# Patient Record
Sex: Female | Born: 1980 | Race: White | Hispanic: No | Marital: Married | Smoking: Light tobacco smoker
Health system: Southern US, Community
[De-identification: ages and names within clinical notes are randomized; demographics above are authoritative.]

## PROBLEM LIST (undated history)

## (undated) DIAGNOSIS — I8310 Varicose veins of unspecified lower extremity with inflammation: Secondary | ICD-10-CM

## (undated) DIAGNOSIS — IMO0001 Reserved for inherently not codable concepts without codable children: Secondary | ICD-10-CM

## (undated) DIAGNOSIS — J45909 Unspecified asthma, uncomplicated: Secondary | ICD-10-CM

## (undated) DIAGNOSIS — M2550 Pain in unspecified joint: Secondary | ICD-10-CM

## (undated) DIAGNOSIS — L309 Dermatitis, unspecified: Secondary | ICD-10-CM

## (undated) DIAGNOSIS — F411 Generalized anxiety disorder: Secondary | ICD-10-CM

## (undated) DIAGNOSIS — J309 Allergic rhinitis, unspecified: Secondary | ICD-10-CM

## (undated) HISTORY — DX: Dermatitis, unspecified: L30.9

## (undated) HISTORY — DX: Generalized anxiety disorder: F41.1

## (undated) HISTORY — DX: Varicose veins of unspecified lower extremity with inflammation: I83.10

## (undated) HISTORY — PX: WISDOM TOOTH EXTRACTION: SHX21

## (undated) HISTORY — DX: Allergic rhinitis, unspecified: J30.9

## (undated) HISTORY — DX: Reserved for inherently not codable concepts without codable children: IMO0001

## (undated) HISTORY — DX: Pain in unspecified joint: M25.50

## (undated) HISTORY — PX: CHOLECYSTECTOMY: SHX55

## (undated) HISTORY — DX: Unspecified asthma, uncomplicated: J45.909

---
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
## 2000-03-20 ENCOUNTER — Other Ambulatory Visit: Admission: RE | Admit: 2000-03-20 | Discharge: 2000-03-20 | Payer: Self-pay | Admitting: Obstetrics and Gynecology

## 2001-05-26 ENCOUNTER — Other Ambulatory Visit: Admission: RE | Admit: 2001-05-26 | Discharge: 2001-05-26 | Payer: Self-pay | Admitting: Obstetrics and Gynecology

## 2002-09-14 ENCOUNTER — Ambulatory Visit (HOSPITAL_COMMUNITY): Admission: RE | Admit: 2002-09-14 | Discharge: 2002-09-14 | Payer: Self-pay | Admitting: Internal Medicine

## 2006-05-12 ENCOUNTER — Emergency Department (HOSPITAL_COMMUNITY): Admission: EM | Admit: 2006-05-12 | Discharge: 2006-05-13 | Payer: Self-pay | Admitting: Emergency Medicine

## 2007-09-28 ENCOUNTER — Encounter: Admission: RE | Admit: 2007-09-28 | Discharge: 2007-09-28 | Payer: Self-pay | Admitting: Obstetrics and Gynecology

## 2008-06-14 IMAGING — US US TRANSVAGINAL NON-OB
1 series · 14 of 25 positions shown · non-contrast
Comparison: none

CLINICAL DATA: Left-sided pain and swelling.
 TRANSABDOMINAL AND TRANSVAGINAL PELVIC ULTRASOUND:
TECHNIQUE: Both transabdominal and transvaginal ultrasound examinations of the pelvis were performed including evaluation of the uterus, ovaries, adnexal regions, and pelvic cul-de-sac.

[Series 1: unknown · 0.32mm/px · 14 of 32 slices shown]
[im 1/32]
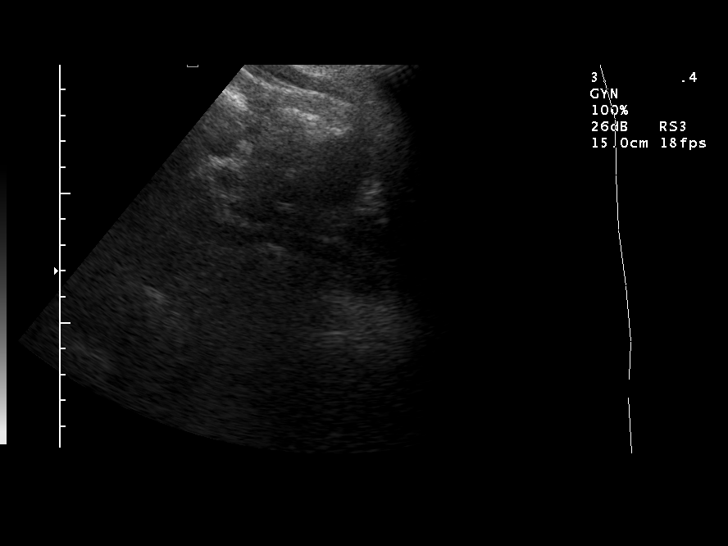
[im 3/32]
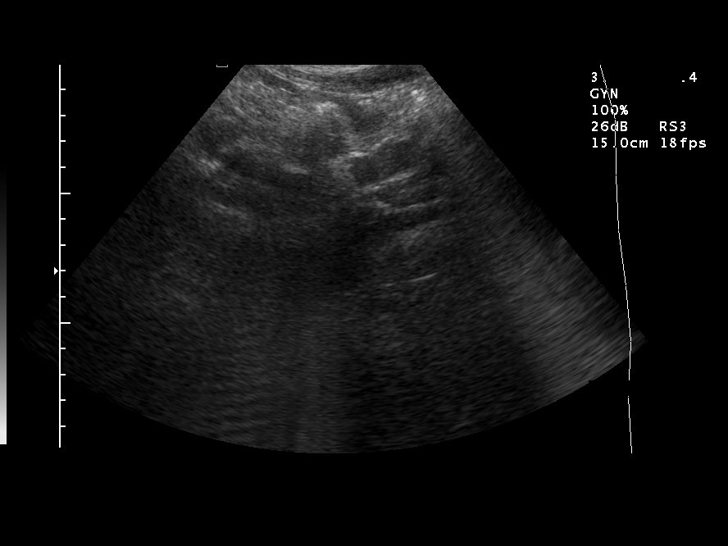
[im 6/32]
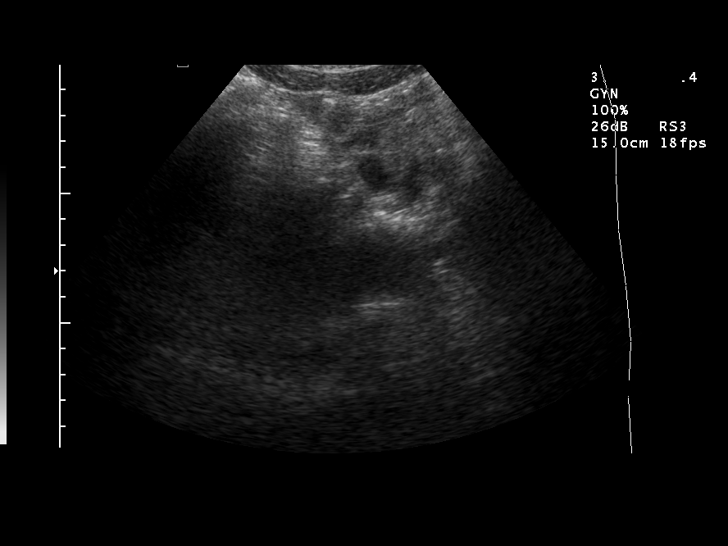
[im 8/32]
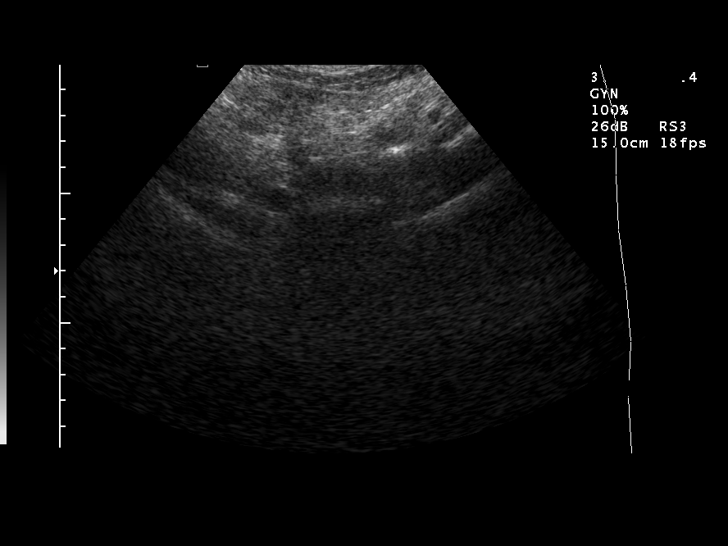
[im 11/32]
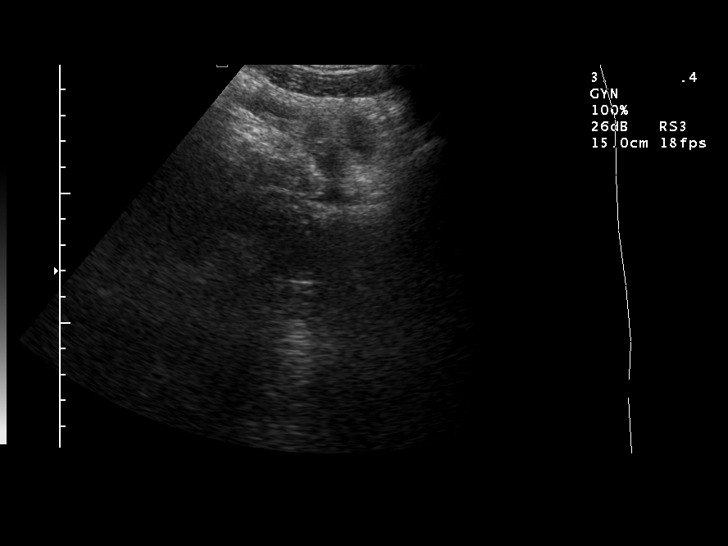
[im 12/32]
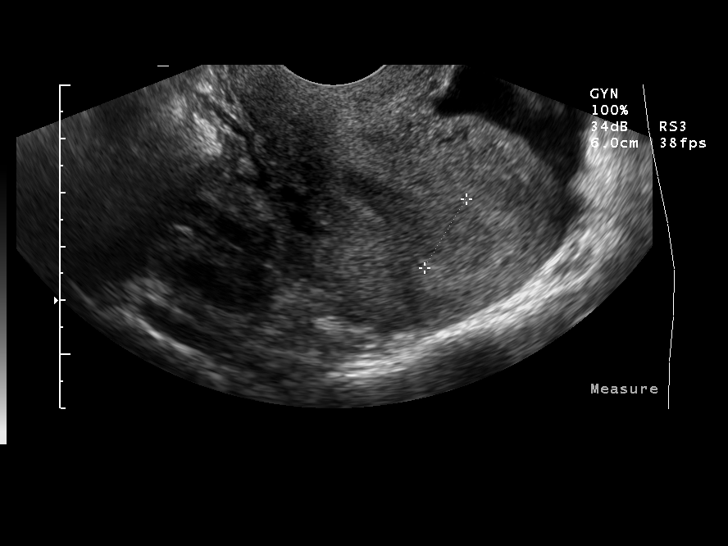
[im 15/32]
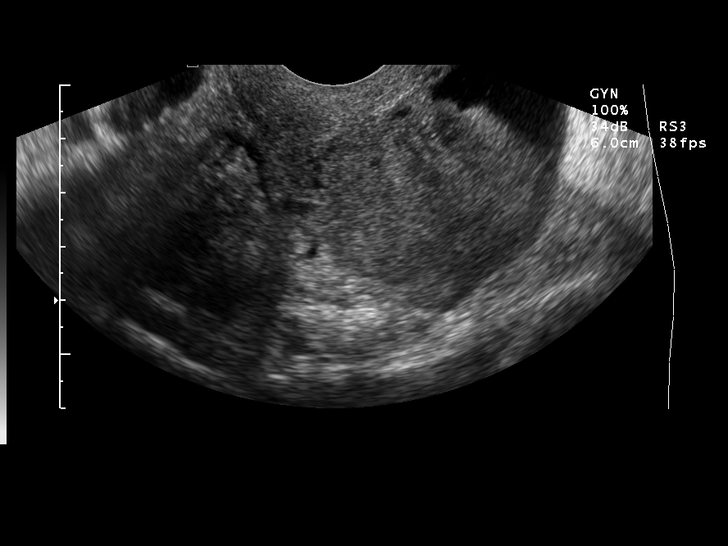
[im 17/32]
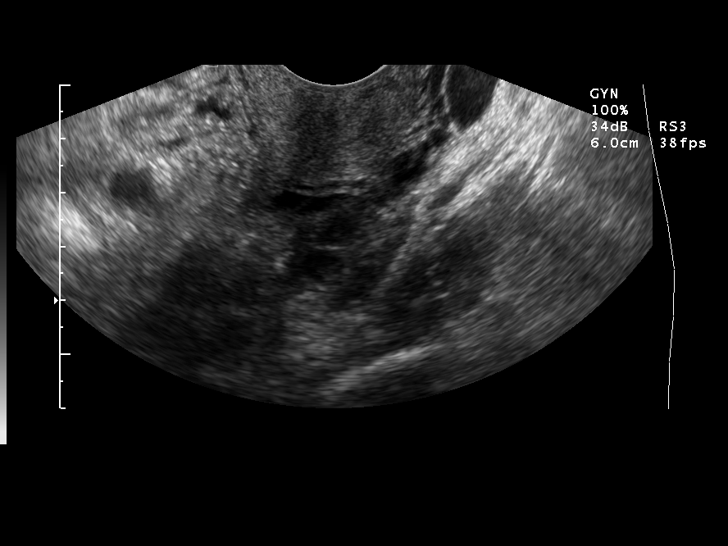
[im 20/32]
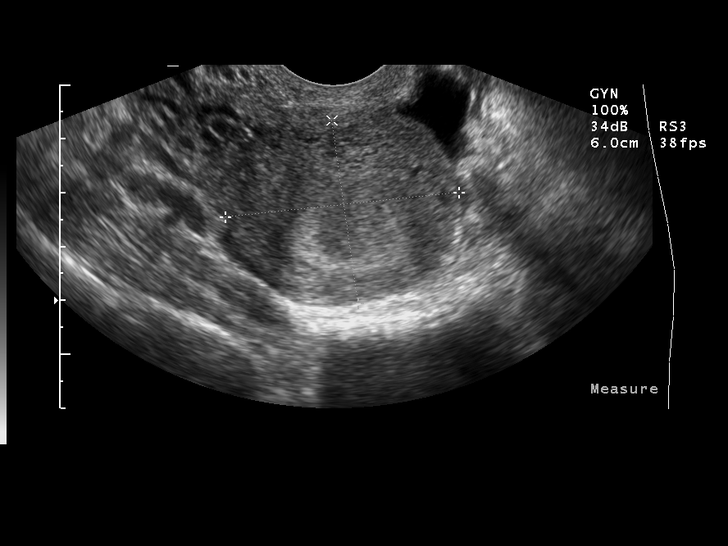
[im 21/32]
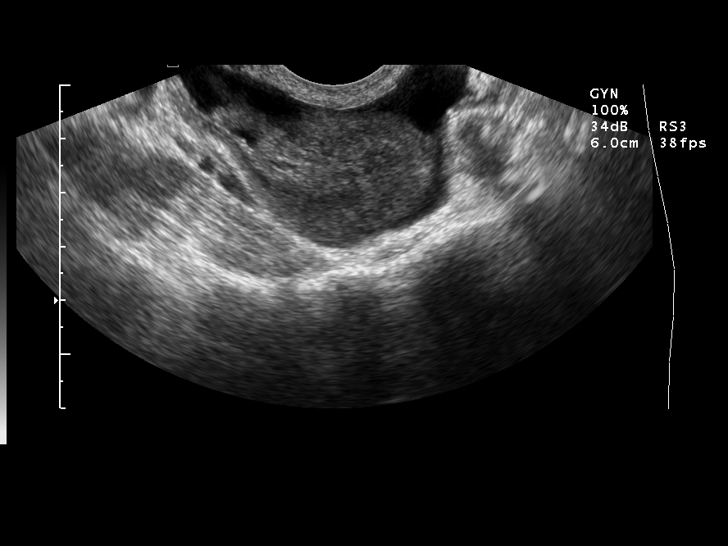
[im 24/32]
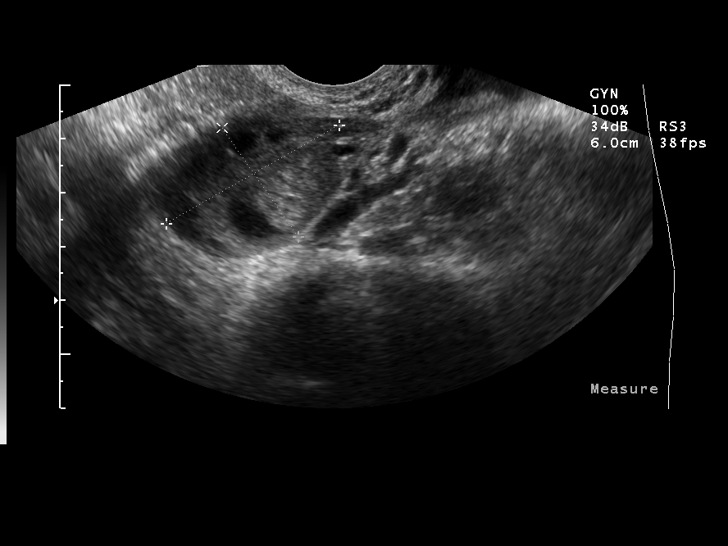
[im 26/32]
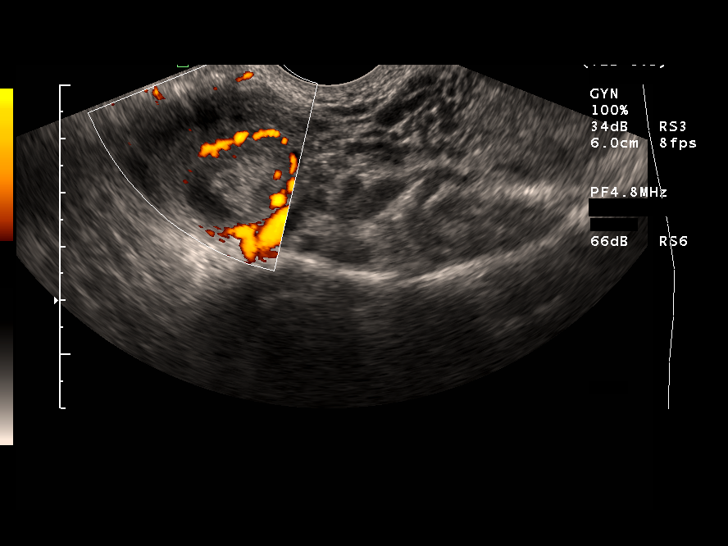
[im 29/32]
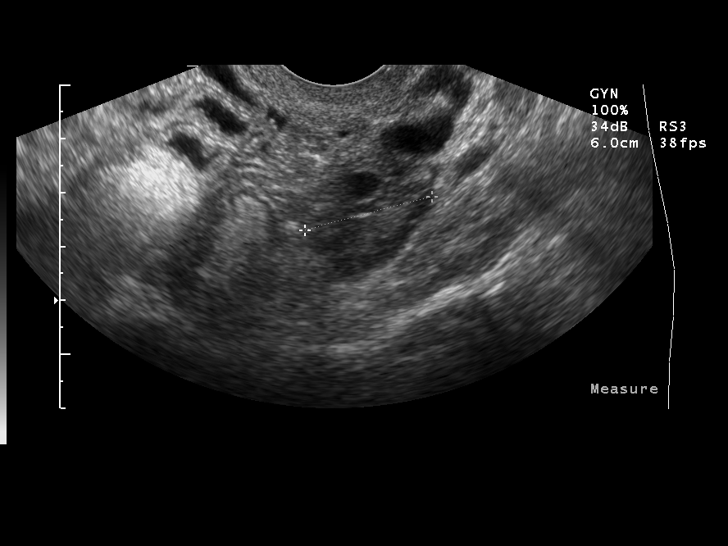
[im 32/32]
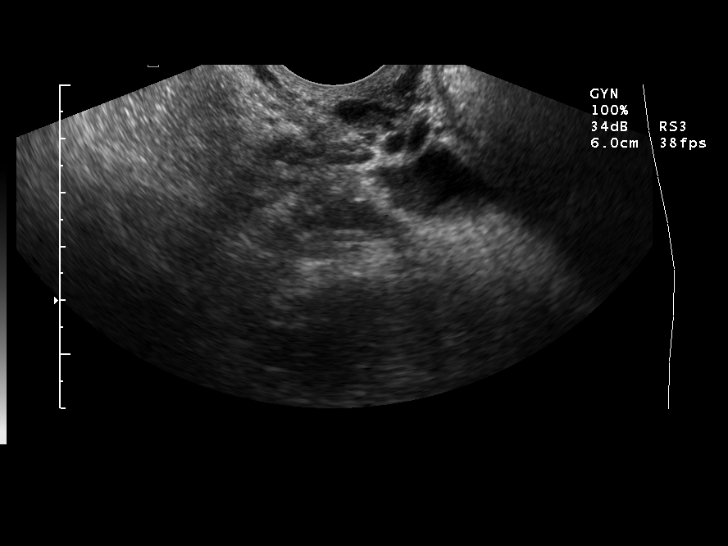

[14 of 25 positions shown; findings below may reference images not displayed]

FINDINGS: The uterus measures 6.9 x 3.4 x 4.4 cm and has an endometrial stripe thickness of approximately 1.2 cm.  The right ovary measures 3.7 x 2.5 x 2.3 cm. The left ovary measures 3.6 x 2.0 x 2.4 cm.  There is a small to moderate amount of free pelvic fluid.
IMPRESSION: Small to moderate amount of free pelvic fluid. Otherwise, negative.

## 2008-11-30 ENCOUNTER — Encounter: Payer: Self-pay | Admitting: Infectious Diseases

## 2008-12-01 ENCOUNTER — Ambulatory Visit: Payer: Self-pay | Admitting: Infectious Diseases

## 2008-12-01 DIAGNOSIS — K1120 Sialoadenitis, unspecified: Secondary | ICD-10-CM | POA: Insufficient documentation

## 2008-12-01 LAB — CONVERTED CEMR LAB: EBV NA IgG: 1.91 — ABNORMAL HIGH

## 2008-12-25 ENCOUNTER — Encounter: Payer: Self-pay | Admitting: Infectious Diseases

## 2009-04-26 ENCOUNTER — Inpatient Hospital Stay (HOSPITAL_COMMUNITY): Admission: AD | Admit: 2009-04-26 | Discharge: 2009-04-26 | Payer: Self-pay | Admitting: Obstetrics and Gynecology

## 2010-06-17 LAB — URINALYSIS, ROUTINE W REFLEX MICROSCOPIC
Ketones, ur: 40 mg/dL — AB
Leukocytes, UA: NEGATIVE
Nitrite: NEGATIVE
Protein, ur: NEGATIVE mg/dL

## 2010-06-17 LAB — URINE MICROSCOPIC-ADD ON

## 2010-06-17 LAB — COMPREHENSIVE METABOLIC PANEL
ALT: 19 U/L (ref 0–35)
Alkaline Phosphatase: 44 U/L (ref 39–117)
CO2: 22 mEq/L (ref 19–32)
Chloride: 104 mEq/L (ref 96–112)
GFR calc Af Amer: >60 mL/min (ref >60)
Glucose, Bld: 83 mg/dL (ref 70–99)
Potassium: 3.7 mEq/L (ref 3.5–5.1)
Sodium: 135 mEq/L (ref 135–145)
Total Bilirubin: 0.6 mg/dL (ref 0.3–1.2)
Total Protein: 6.6 g/dL (ref 6.0–8.3)

## 2010-06-17 LAB — WET PREP, GENITAL: Trich, Wet Prep: NONE SEEN

## 2010-06-17 LAB — CBC
MCHC: 34.3 g/dL (ref 30.0–36.0)
MCV: 93.7 fL (ref 78.0–100.0)
RBC: 3.62 MIL/uL — ABNORMAL LOW (ref 3.87–5.11)
RDW: 13.1 % (ref 11.5–15.5)

## 2010-06-17 LAB — GC/CHLAMYDIA PROBE AMP, GENITAL: Chlamydia, DNA Probe: POSITIVE — AB

## 2010-06-17 LAB — POCT PREGNANCY, URINE: Preg Test, Ur: NEGATIVE

## 2010-08-16 NOTE — H&P (Signed)
[facility]  Patient:    Megan Hardy, Megan Hardy                   MRN: [id number] Adm. Date:  [id number] Disc. Date: [id number] Attending:  Katha Cabal                         History and Physical  CHIEF COMPLAINT:  Right upper quadrant pain, chronic cystitis.  HISTORY OF PRESENT ILLNESS:  This is an 30 year old girl whose had recurrent bouts of nausea and vomiting. It is felt that she had gallstones on an ultrasound and based on her clinical behavior.  PAST MEDICAL HISTORY:  Aside from these bouts of upper abdominal pain, unremarkable.  PHYSICAL EXAMINATION:  GENERAL:  Thin, white, female.  HEENT:  Unremarkable.  CHEST:  Clear.  HEART:  Sinus rhythm without murmurs or gallops.  ABDOMEN:  Nontender presently.  IMPRESSION:  Chronic cholecystitis.  PLAN:  Lap chole. DD:  08/21/99 TD:  08/21/99 Job: [id number]

## 2011-05-30 IMAGING — US US TRANSVAGINAL NON-OB
1 series · 14 of 23 positions shown · non-contrast
Comparison: 05/12/2006

CLINICAL DATA: Pelvic pain.  Vaginal bleeding.  Nausea vomiting.
LMP 04/09/2009

TRANSABDOMINAL AND TRANSVAGINAL ULTRASOUND OF PELVIS
TECHNIQUE: Both transabdominal and transvaginal ultrasound
examinations of the pelvis were performed including evaluation of
the uterus, ovaries, adnexal regions, and pelvic cul-de-sac.

[Series 1: us pelvis complete · 0.27mm/px · 23 acquisitions, 14 frames shown]
[im 1/23]
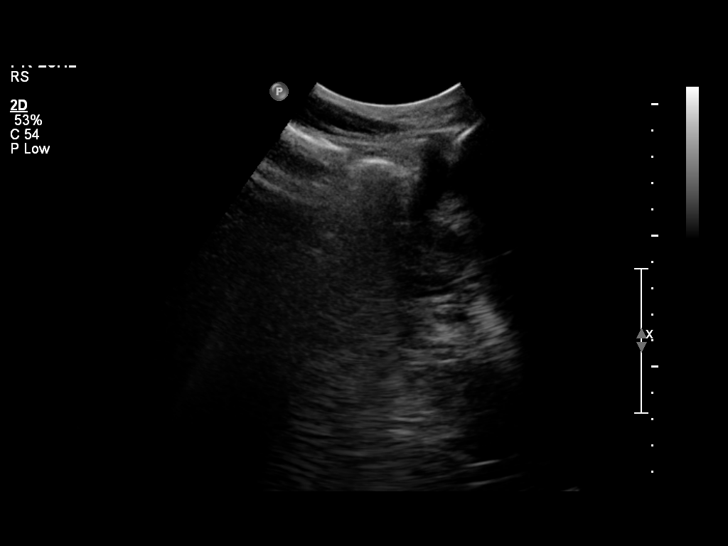
[im 3/23]
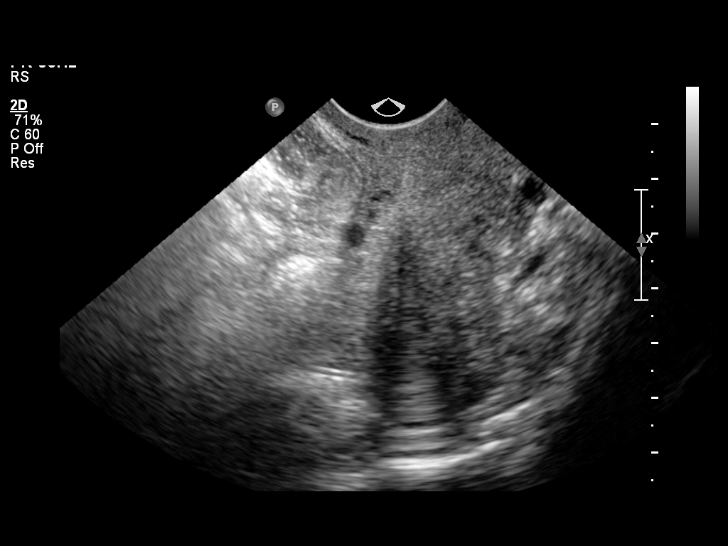
[im 5/23]
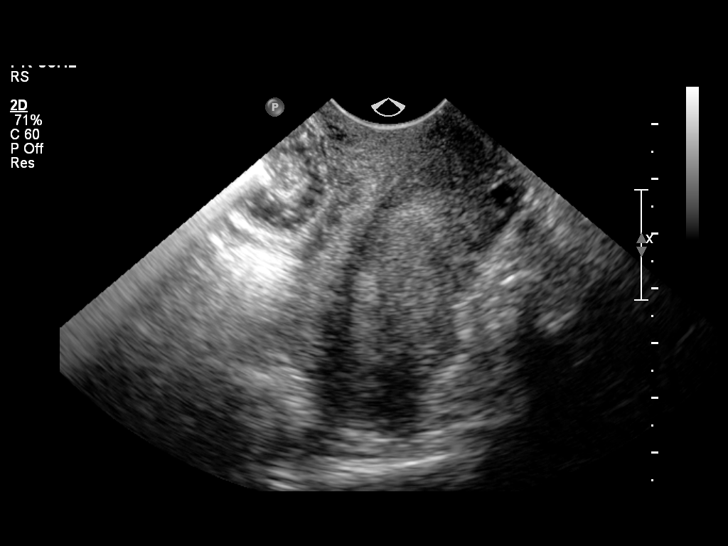
[im 6/23]
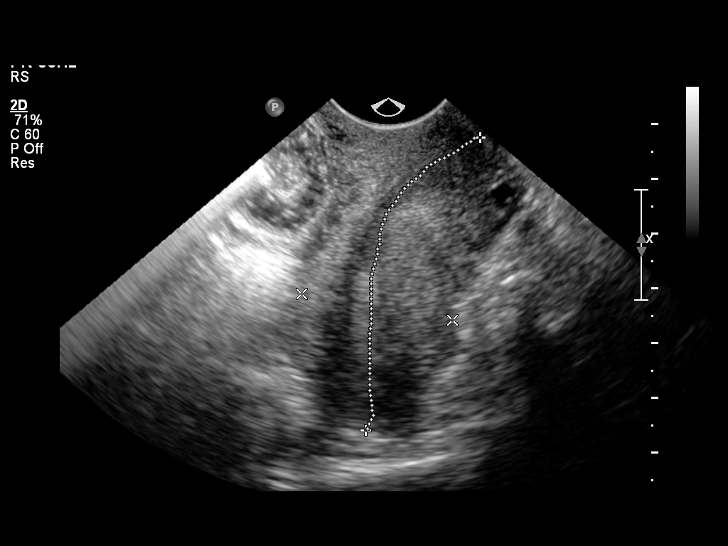
[im 8/23]
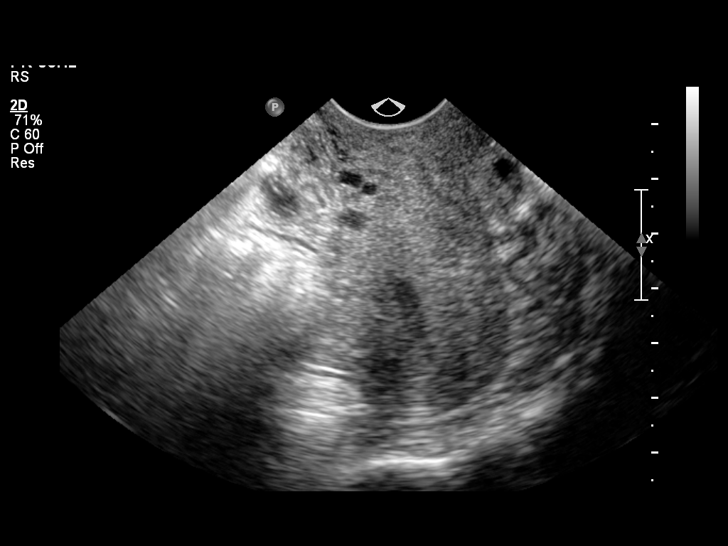
[im 10/23]
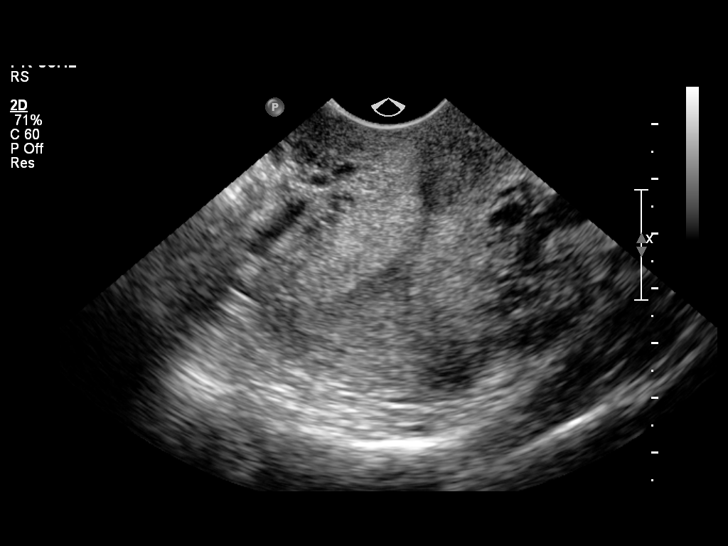
[im 11/23]
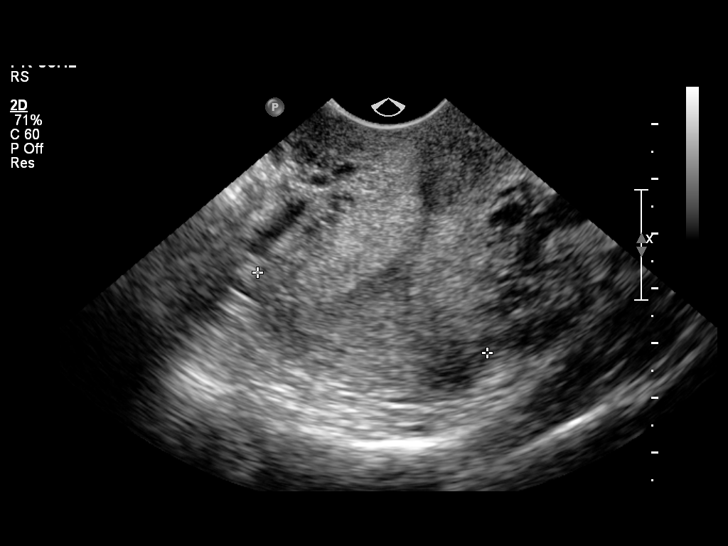
[im 13/23]
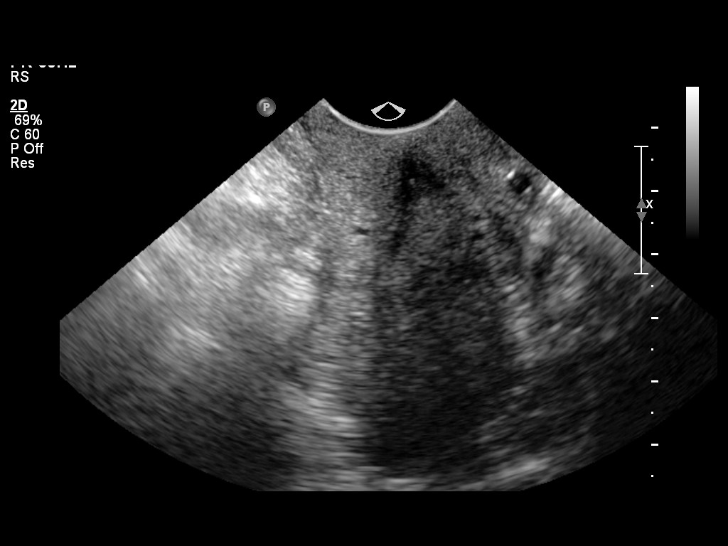
[im 14/23]
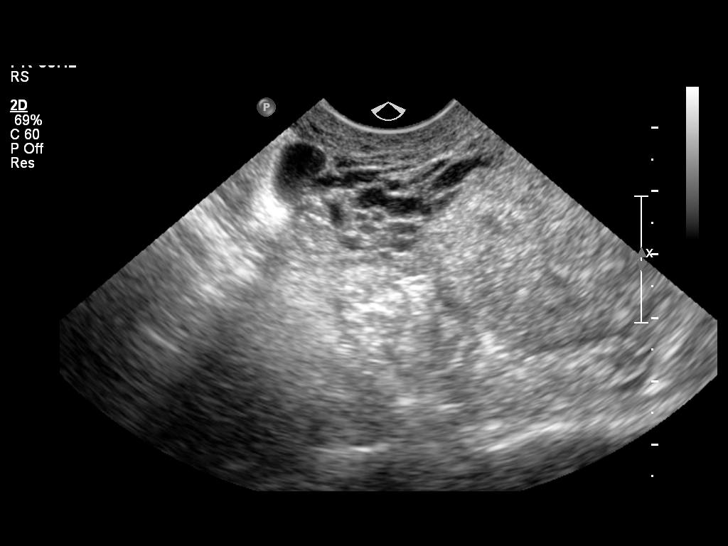
[im 16/23]
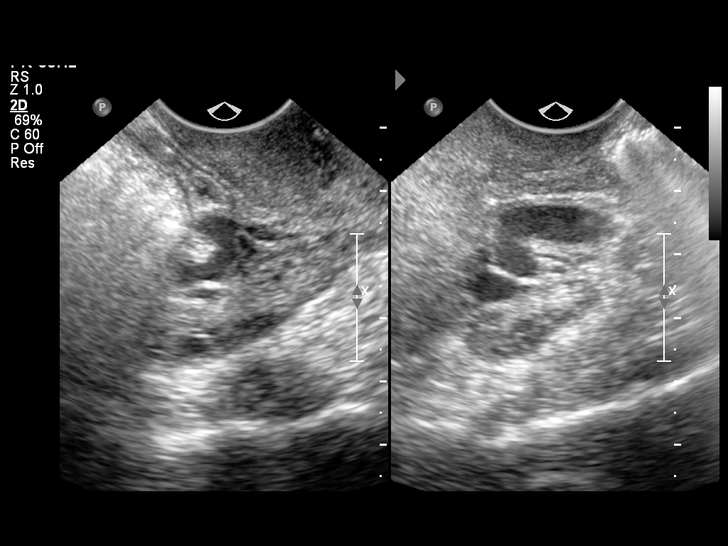
[im 18/23]
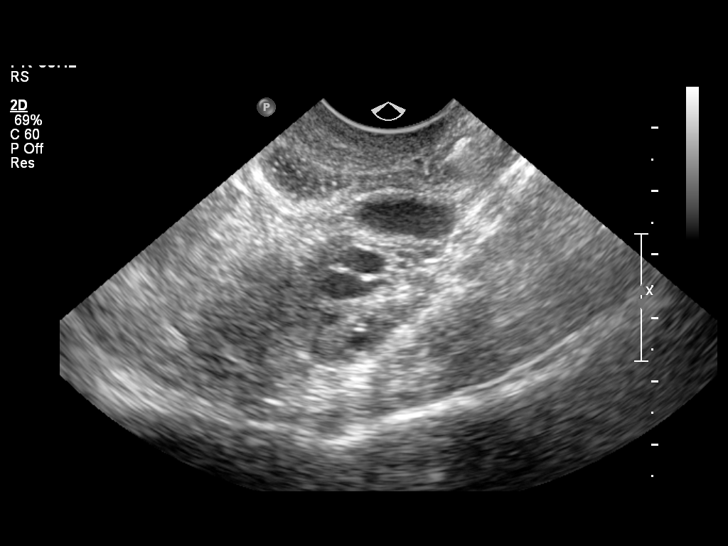
[im 19/23]
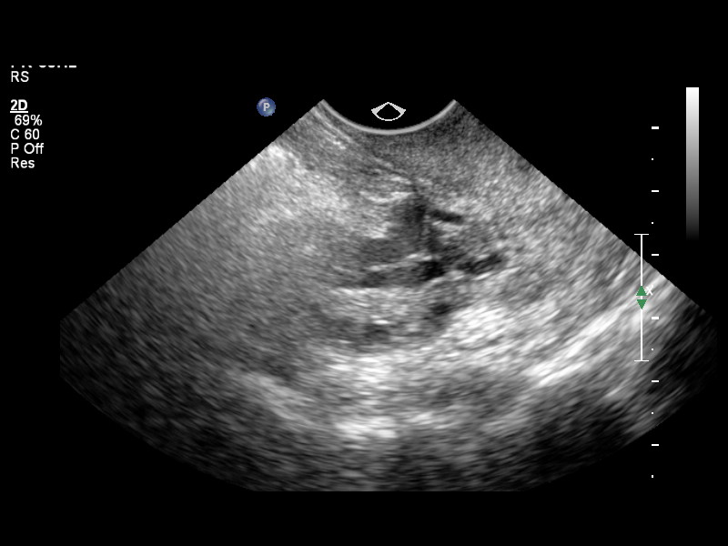
[im 21/23]
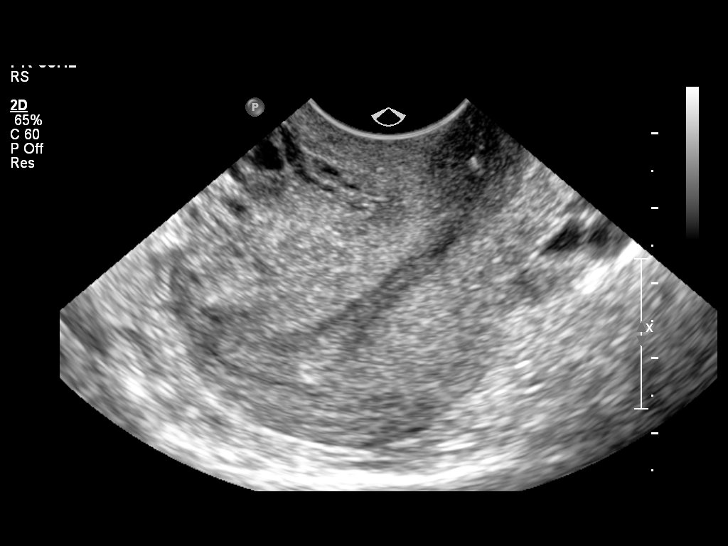
[im 23/23]
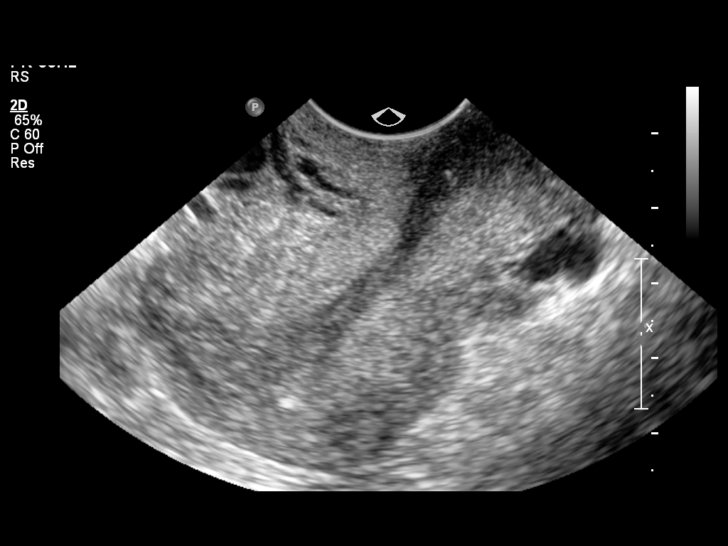

[14 of 23 positions shown; findings below may reference images not displayed]

FINDINGS: Uterus measures 6.3 x 2.8 x 4.4 cm. No fibroids or other uterine
masses identified.

Endometrium measures 6 mm in thickness.  Within normal limits in
appearance.

Right Ovary is not directly visualized by transabdominal or
transvaginal sonography, however no adnexal mass identified.

Left Ovary measures 3.0 x 1.0 x 1.7 cm.  Normal appearance.

Other Findings:  No adnexal mass or free fluid identified.
IMPRESSION: Normal study.  No evidence of pelvic mass or other significant
abnormality.

## 2013-03-10 ENCOUNTER — Encounter: Payer: Self-pay | Admitting: Neurology

## 2013-03-14 ENCOUNTER — Ambulatory Visit (INDEPENDENT_AMBULATORY_CARE_PROVIDER_SITE_OTHER): Payer: BC Managed Care – PPO | Admitting: Neurology

## 2013-03-14 ENCOUNTER — Encounter: Payer: Self-pay | Admitting: Neurology

## 2013-03-14 VITALS — BP 108/67 | HR 64 | Ht 63.0 in | Wt 119.0 lb

## 2013-03-14 DIAGNOSIS — G43909 Migraine, unspecified, not intractable, without status migrainosus: Secondary | ICD-10-CM | POA: Insufficient documentation

## 2013-03-14 MED ORDER — RIZATRIPTAN BENZOATE 10 MG PO TBDP: 10.0000 mg | ORAL_TABLET | ORAL | Status: AC | PRN

## 2013-03-14 NOTE — Progress Notes (Signed)
[facility]  PATIENT: Megan Hardy DOB: 05/27/1980  HISTORICAL Megan Hardy is a 32 years old right-handed Caucasian female, referred by her primary care physician Dr. Shirlean Mylar for evaluation of migraine headaches  She has a history of migraines since high school, at that time, she had frequent occurrence, but has lessened over the past few years until July 2014, she began to have frequent headaches again, she attributed to excessive stress, trigger for migraine also includes certain food, such as peanut, red wines.  Her typical migraines starts at right occipital region, spreading forward, settled at right retro-orbital area, severe pounding headaches,with associated light, noise sensitivity, it can last for a few days, nausea, vomiting,  She is now only taking over-the-counter ibuprofen 600 mg as needed, without helping her headache much, she has typical severe migraine headaches about once a month, in between, she has frequent bilateral occipital, area shoulder achy pain, She was started on Celexa, Flexeril just a week ago, in hope as a preventive medication, there was no significant side effect noticed.  She reported previously she has tried Imitrex, making her headache worse, midrin cause rash,    REVIEW OF SYSTEMS: Full 14 system review of systems performed and notable only for ringing in ears, achy muscles, headaches, anxiety  ALLERGIES: Allergies  Allergen Reactions  . Erythromycin   . Novocain [Procaine]   . Phenergan [Promethazine Hcl]   . Tramadol   . Hydrocodone-Acetaminophen Swelling and Rash    HOME MEDICATIONS: Outpatient Prescriptions Prior to Visit  Medication Sig Dispense Refill  . benzonatate (TESSALON) 100 MG capsule Take by mouth 3 (three) times daily as needed for cough.      . cyclobenzaprine (FLEXERIL) 10 MG tablet Take 10 mg by mouth at bedtime as needed for muscle spasms.      Marland Kitchen ibuprofen (ADVIL,MOTRIN) 200 MG tablet Take 200 mg by  mouth as needed.      Marland Kitchen albuterol (PROVENTIL HFA;VENTOLIN HFA) 108 (90 BASE) MCG/ACT inhaler Inhale into the lungs every 6 (six) hours as needed for wheezing or shortness of breath.      . citalopram (CELEXA) 10 MG tablet Take 10 mg by mouth daily.      Marland Kitchen etonogestrel-ethinyl estradiol (NUVARING) 0.12-0.015 MG/24HR vaginal ring Place 1 each vaginally every 28 (twenty-eight) days. Insert vaginally and leave in place for 3 consecutive weeks, then remove for 1 week.      . predniSONE (DELTASONE) 20 MG tablet Take 20 mg by mouth 2 (two) times daily.      . Pseudoephedrine-APAP-DM (DAYQUIL MULTI-SYMPTOM PO) Take by mouth.       No facility-administered medications prior to visit.    PAST MEDICAL HISTORY: Past Medical History  Diagnosis Date  . Eczema     childhood  . Asthma     remote history  . Pain in joint, multiple sites   . Anxiety state, unspecified   . Varicose veins of lower extremities with inflammation   . Allergic rhinitis, cause unspecified   . Pain in joint, multiple sites   . Myalgia and myositis, unspecified     PAST SURGICAL HISTORY: Past Surgical History  Procedure Laterality Date  . Cholecystectomy      FAMILY HISTORY: Family History  Problem Relation Age of Onset  . Migraines Father   . Migraines Paternal Grandmother   . Skin cancer Maternal Grandmother   . Pancreatic cancer Maternal Grandmother     SOCIAL HISTORY:  History   Social History  .  Marital Status: Married    Spouse Name: Josh    Number of Children: 1  . Years of Education: college   Occupational History  .      [employer]   Social History Main Topics  . Smoking status: Light Tobacco Smoker    Types: Cigarettes  . Smokeless tobacco: Never Used  . Alcohol Use: Yes     Comment: beer/wine-   socially  . Drug Use: No  . Sexual Activity: Not on file   Other Topics Concern  . Not on file   Social History Narrative   Patient is married. Sharia Reeve)   Patient has one child.     Patient drinks one cup of coffee daily, soda- 1-2 a week and tea 1-2 times a week.   Patient works full time for [employer]   Education - College   Right handed.     PHYSICAL EXAM   Filed Vitals:   03/14/13 1005  BP: 108/67  Pulse: 64  Height: 5\' 3"  (1.6 m)  Weight: 119 lb (53.978 kg)    Not recorded    Body mass index is 21.09 kg/(m^2).   Generalized: In no acute distress  Neck: Supple, no carotid bruits   Cardiac: Regular rate rhythm  Pulmonary: Clear to auscultation bilaterally  Musculoskeletal: No deformity  Neurological examination  Mentation: Alert oriented to time, place, history taking, and causual conversation  Cranial nerve II-XII: Pupils were equal round reactive to light extraocular movements were full, Visual field were full on confrontational test. Bilateral fundi were sharp.  Facial sensation and strength were normal. Hearing was intact to finger rubbing bilaterally. Uvula tongue midline.  head turning and shoulder shrug and were normal and symmetric.Tongue protrusion into cheek strength was normal.  Motor: normal tone, bulk and strength.  Sensory: Intact to fine touch, pinprick, preserved vibratory sensation, and proprioception at toes.  Coordination: Normal finger to nose, heel-to-shin bilaterally there was no truncal ataxia  Gait: Rising up from seated position without assistance, normal stance, without trunk ataxia, moderate stride, good arm swing, smooth turning, able to perform tiptoe, and heel walking without difficulty.   Romberg signs: Negative  Deep tendon reflexes: Brachioradialis 2/2, biceps 2/2, triceps 2/2, patellar 2/2, Achilles 2/2, plantar responses were flexor bilaterally.   DIAGNOSTIC DATA (LABS, IMAGING, TESTING) - I reviewed patient records, labs, notes, testing and imaging myself where available.  Lab Results  Component Value Date   WBC 3.6* 04/26/2009   HGB 11.6* 04/26/2009   HCT 33.9* 04/26/2009   MCV 93.7  04/26/2009   PLT 136* 04/26/2009      Component Value Date/Time   NA 135 04/26/2009 1939   K 3.7 04/26/2009 1939   CL 104 04/26/2009 1939   CO2 22 04/26/2009 1939   GLUCOSE 83 04/26/2009 1939   BUN 7 04/26/2009 1939   CREATININE 0.80 04/26/2009 1939   CALCIUM 8.8 04/26/2009 1939   PROT 6.6 04/26/2009 1939   ALBUMIN 3.6 04/26/2009 1939   AST 21 04/26/2009 1939   ALT 19 04/26/2009 1939   ALKPHOS 44 04/26/2009 1939   BILITOT 0.6 04/26/2009 1939   GFRNONAA >60 04/26/2009 1939   GFRAA  Value: >60        The eGFR has been calculated using the MDRD equation. This calculation has not been validated in all clinical situations. eGFR's persistently <60 mL/min signify possible Chronic Kidney Disease. 04/26/2009 1939     ASSESSMENT AND PLAN   32 years old Caucasian  female, with a long-standing history of migraine, was more frequent migraine since July 2014, triggered by stress, normal neurological examination, she was recently started on Celexa, Flexeril as preventive medications,  1 will try Maxalt as abortive treatment, 2 also gave her samples of zomig nasal spray, Relpax. 3. RTC in 3 month with Gerlene Fee, M.D. Ph.D.  [facility] [address] [place], [place] [address] [phone]

## 2013-06-20 ENCOUNTER — Telehealth: Payer: Self-pay | Admitting: Nurse Practitioner

## 2013-06-20 ENCOUNTER — Ambulatory Visit: Payer: BC Managed Care – PPO | Admitting: Nurse Practitioner

## 2013-06-20 NOTE — Telephone Encounter (Signed)
No show for scheduled appt 

## 2014-08-11 ENCOUNTER — Encounter (HOSPITAL_COMMUNITY): Payer: Self-pay | Admitting: General Practice

## 2014-08-11 NOTE — H&P (Addendum)
Megan DollarKirsten Hardy Hardy is an 34 y.o. female  Recent office SHG/Note Megan Hardy [phone] 08/02/2014 S:  Megan MandesKirsten presents today for ultrasound evaluation and saline infusion ultrasound.  She has had a lot of brown discharge prior to and after her menstrual cycle.  She is trying to conceive; but with the irregular cycles, obviously that is bothersome to her.   O:  Saline infusion ultrasound was carried out without difficulty.  She does have a moderate sized endometrial mass on the posterior wall consistent with a large polyp. A/P:  1) Abnormal bleeding.  Endometrial mass.  Recommend hysteroscopic resection of this.  Did recommend that she use protection until we get this removed, and she will need to wait for 2 cycles after we remove this to try to conceive.  Discussed the risks and benefits and pros and cons for hysteroscopic resection of this.  She does give her informed consent.   Megan Campavid Aluna Whiston, MD/[id number]  Menstrual History No LMP recorded.    Past Medical History  Diagnosis Date  . Eczema     childhood  . Asthma     remote history  . Pain in joint, multiple sites   . Anxiety state, unspecified   . Varicose veins of lower extremities with inflammation   . Allergic rhinitis, cause unspecified   . Pain in joint, multiple sites   . Myalgia and myositis, unspecified     Past Surgical History  Procedure Laterality Date  . Cholecystectomy      Family History  Problem Relation Age of Onset  . Migraines Father   . Migraines Paternal Grandmother   . Skin cancer Maternal Grandmother   . Pancreatic cancer Maternal Grandmother     Social History:  reports that she has been smoking Cigarettes.  She has never used smokeless tobacco. She reports that she drinks alcohol. She reports that she does not use illicit drugs.  Allergies:  Allergies  Allergen Reactions  . Erythromycin   . Novocain [Procaine]   . Phenergan [Promethazine Hcl]   . Tramadol   . Hydrocodone-Acetaminophen Swelling  and Rash    No prescriptions prior to admission    ROS  There were no vitals taken for this visit. Physical Exam  No results found for this or any previous visit (from the past 24 hour(s)).  No results found.  Assessment/Plan: AUB EM mass Plan Hyst D&Hardy R&B discussed informed consent obtained DL  Megan Hardy 0/45/40985/13/2016, 1:47 PM   08/14/14 0715 This patient has been seen and examined.   All of her questions were answered.  Labs and vital signs reviewed.  Informed consent has been obtained.  The History and Physical is current. DL

## 2014-08-13 MED ORDER — DEXTROSE 5 % IV SOLN
2.0000 g | INTRAVENOUS | Status: AC
  Administered 2014-08-14: 2 g via INTRAVENOUS
  Filled 2014-08-13: qty 2

## 2014-08-14 ENCOUNTER — Encounter (HOSPITAL_COMMUNITY)
Admission: RE | Disposition: A | Discharge status: Home / Self Care | Payer: Self-pay | Source: Ambulatory Visit | Attending: Obstetrics and Gynecology

## 2014-08-14 ENCOUNTER — Ambulatory Visit (HOSPITAL_COMMUNITY): Payer: 59 | Admitting: Anesthesiology

## 2014-08-14 ENCOUNTER — Ambulatory Visit (HOSPITAL_COMMUNITY)
Admission: RE | Admit: 2014-08-14 | Discharge: 2014-08-14 | Disposition: A | Discharge status: Home / Self Care | Payer: 59 | Source: Ambulatory Visit | Attending: Obstetrics and Gynecology | Admitting: Obstetrics and Gynecology

## 2014-08-14 DIAGNOSIS — Z888 Allergy status to other drugs, medicaments and biological substances status: Secondary | ICD-10-CM | POA: Diagnosis not present

## 2014-08-14 DIAGNOSIS — F419 Anxiety disorder, unspecified: Secondary | ICD-10-CM | POA: Diagnosis not present

## 2014-08-14 DIAGNOSIS — Z886 Allergy status to analgesic agent status: Secondary | ICD-10-CM | POA: Diagnosis not present

## 2014-08-14 DIAGNOSIS — Z9049 Acquired absence of other specified parts of digestive tract: Secondary | ICD-10-CM | POA: Diagnosis not present

## 2014-08-14 DIAGNOSIS — N939 Abnormal uterine and vaginal bleeding, unspecified: Secondary | ICD-10-CM | POA: Insufficient documentation

## 2014-08-14 DIAGNOSIS — J45909 Unspecified asthma, uncomplicated: Secondary | ICD-10-CM | POA: Insufficient documentation

## 2014-08-14 DIAGNOSIS — Z881 Allergy status to other antibiotic agents status: Secondary | ICD-10-CM | POA: Insufficient documentation

## 2014-08-14 DIAGNOSIS — M791 Myalgia: Secondary | ICD-10-CM | POA: Diagnosis not present

## 2014-08-14 HISTORY — PX: DILATATION & CURETTAGE/HYSTEROSCOPY WITH MYOSURE: SHX6511

## 2014-08-14 LAB — CBC
HEMATOCRIT: 43.6 % (ref 36.0–46.0)
Hemoglobin: 15.1 g/dL — ABNORMAL HIGH (ref 12.0–15.0)
MCH: 32.2 pg (ref 26.0–34.0)
MCHC: 34.6 g/dL (ref 30.0–36.0)
MCV: 93 fL (ref 78.0–100.0)
Platelets: 168 10*3/uL (ref 150–400)
RBC: 4.69 MIL/uL (ref 3.87–5.11)
RDW: 13.1 % (ref 11.5–15.5)
WBC: 6.7 10*3/uL (ref 4.0–10.5)

## 2014-08-14 LAB — PREGNANCY, URINE: Preg Test, Ur: NEGATIVE

## 2014-08-14 SURGERY — DILATATION & CURETTAGE/HYSTEROSCOPY WITH MYOSURE
Anesthesia: General

## 2014-08-14 MED ORDER — FENTANYL CITRATE (PF) 100 MCG/2ML IJ SOLN: 25.0000 ug | INTRAMUSCULAR | Status: DC | PRN

## 2014-08-14 MED ORDER — MIDAZOLAM HCL 2 MG/2ML IJ SOLN
INTRAMUSCULAR | Status: DC | PRN
  Administered 2014-08-14: 1 mg via INTRAVENOUS

## 2014-08-14 MED ORDER — MIDAZOLAM HCL 2 MG/2ML IJ SOLN
INTRAMUSCULAR | Status: AC
  Filled 2014-08-14: qty 2

## 2014-08-14 MED ORDER — FENTANYL CITRATE (PF) 100 MCG/2ML IJ SOLN
INTRAMUSCULAR | Status: AC
  Filled 2014-08-14: qty 2

## 2014-08-14 MED ORDER — ONDANSETRON HCL 4 MG/2ML IJ SOLN
INTRAMUSCULAR | Status: AC
  Filled 2014-08-14: qty 2

## 2014-08-14 MED ORDER — LACTATED RINGERS IV SOLN
INTRAVENOUS | Status: DC
  Administered 2014-08-14 (×2): via INTRAVENOUS

## 2014-08-14 MED ORDER — LIDOCAINE-EPINEPHRINE 1 %-1:100000 IJ SOLN
INTRAMUSCULAR | Status: AC
  Filled 2014-08-14: qty 1

## 2014-08-14 MED ORDER — LIDOCAINE HCL (CARDIAC) 20 MG/ML IV SOLN
INTRAVENOUS | Status: DC | PRN
  Administered 2014-08-14: 60 mg via INTRAVENOUS

## 2014-08-14 MED ORDER — PROPOFOL 10 MG/ML IV BOLUS
INTRAVENOUS | Status: AC
  Filled 2014-08-14: qty 80

## 2014-08-14 MED ORDER — ACETAMINOPHEN 10 MG/ML IV SOLN
1000.0000 mg | Freq: Once | INTRAVENOUS | Status: AC
  Administered 2014-08-14: 1000 mg via INTRAVENOUS
  Filled 2014-08-14: qty 100

## 2014-08-14 MED ORDER — PROPOFOL 10 MG/ML IV BOLUS
INTRAVENOUS | Status: DC | PRN
  Administered 2014-08-14: 200 mg via INTRAVENOUS

## 2014-08-14 MED ORDER — PROPOFOL INFUSION 10 MG/ML OPTIME
INTRAVENOUS | Status: DC | PRN
  Administered 2014-08-14: 120 ug/kg/min via INTRAVENOUS

## 2014-08-14 MED ORDER — DEXAMETHASONE SODIUM PHOSPHATE 4 MG/ML IJ SOLN
INTRAMUSCULAR | Status: DC | PRN
  Administered 2014-08-14: 4 mg via INTRAVENOUS

## 2014-08-14 MED ORDER — KETOROLAC TROMETHAMINE 30 MG/ML IJ SOLN
INTRAMUSCULAR | Status: DC | PRN
  Administered 2014-08-14: 30 mg via INTRAVENOUS

## 2014-08-14 MED ORDER — KETOROLAC TROMETHAMINE 30 MG/ML IJ SOLN
30.0000 mg | Freq: Once | INTRAMUSCULAR | Status: DC | PRN
Start: 2014-08-14 — End: 2014-08-14

## 2014-08-14 MED ORDER — LIDOCAINE HCL (CARDIAC) 20 MG/ML IV SOLN
INTRAVENOUS | Status: AC
  Filled 2014-08-14: qty 5

## 2014-08-14 MED ORDER — FENTANYL CITRATE (PF) 100 MCG/2ML IJ SOLN
INTRAMUSCULAR | Status: DC | PRN
  Administered 2014-08-14: 12.5 ug via INTRAVENOUS
  Administered 2014-08-14: 25 ug via INTRAVENOUS
  Administered 2014-08-14: 12.5 ug via INTRAVENOUS
  Administered 2014-08-14: 25 ug via INTRAVENOUS
  Administered 2014-08-14: 12.5 ug via INTRAVENOUS
  Administered 2014-08-14: 25 ug via INTRAVENOUS
  Administered 2014-08-14: 12.5 ug via INTRAVENOUS

## 2014-08-14 MED ORDER — ONDANSETRON HCL 4 MG/2ML IJ SOLN
INTRAMUSCULAR | Status: DC | PRN
  Administered 2014-08-14: 4 mg via INTRAVENOUS

## 2014-08-14 MED ORDER — DEXAMETHASONE SODIUM PHOSPHATE 10 MG/ML IJ SOLN
INTRAMUSCULAR | Status: AC
  Filled 2014-08-14: qty 1

## 2014-08-14 MED ORDER — SCOPOLAMINE 1 MG/3DAYS TD PT72
1.0000 | MEDICATED_PATCH | TRANSDERMAL | Status: DC
Start: 2014-08-14 — End: 2014-08-14
  Administered 2014-08-14: 1.5 mg via TRANSDERMAL

## 2014-08-14 MED ORDER — PROPOFOL 10 MG/ML IV BOLUS
INTRAVENOUS | Status: AC
  Filled 2014-08-14: qty 20

## 2014-08-14 MED ORDER — SCOPOLAMINE 1 MG/3DAYS TD PT72
MEDICATED_PATCH | TRANSDERMAL | Status: AC
  Administered 2014-08-14: 1.5 mg via TRANSDERMAL
  Filled 2014-08-14: qty 1

## 2014-08-14 SURGICAL SUPPLY — 16 items
CATH ROBINSON RED A/P 16FR (CATHETERS) ×3 IMPLANT
CLOTH BEACON ORANGE TIMEOUT ST (SAFETY) ×3 IMPLANT
CONTAINER PREFILL 10% NBF 60ML (FORM) ×6 IMPLANT
DEVICE MYOSURE CLASSIC (MISCELLANEOUS) IMPLANT
DEVICE MYOSURE LITE (MISCELLANEOUS) ×3 IMPLANT
FILTER ARTHROSCOPY CONVERTOR (FILTER) ×3 IMPLANT
GLOVE BIO SURGEON STRL SZ8 (GLOVE) ×3 IMPLANT
GLOVE SURG ORTHO 8.0 STRL STRW (GLOVE) ×3 IMPLANT
GOWN STRL REUS W/TWL LRG LVL3 (GOWN DISPOSABLE) ×6 IMPLANT
PACK VAGINAL MINOR WOMEN LF (CUSTOM PROCEDURE TRAY) ×3 IMPLANT
PAD OB MATERNITY 4.3X12.25 (PERSONAL CARE ITEMS) ×3 IMPLANT
SEAL ROD LENS SCOPE MYOSURE (ABLATOR) ×3 IMPLANT
TOWEL OR 17X24 6PK STRL BLUE (TOWEL DISPOSABLE) ×6 IMPLANT
TUBING AQUILEX INFLOW (TUBING) ×3 IMPLANT
TUBING AQUILEX OUTFLOW (TUBING) ×3 IMPLANT
WATER STERILE IRR 1000ML POUR (IV SOLUTION) ×3 IMPLANT

## 2014-08-14 NOTE — Discharge Instructions (Signed)
DISCHARGE INSTRUCTIONS: D & C/ HYSTEROSCOPY/ Myosure  The following instructions have been prepared to help you care for yourself upon your return home.  May take Ibuprofen or other anti-inflammatory drugs(NSAIDS) after 2:15 pm today  Personal hygiene:  Use sanitary pads for vaginal drainage, not tampons.  Shower the day after your procedure.  NO tub baths, pools or Jacuzzis for 2-3 weeks.  Wipe front to back after using the bathroom.  Activity and limitations:  Do NOT drive or operate any equipment for 24 hours. The effects of anesthesia are still present and drowsiness may result.  Do NOT rest in bed all day.  Walking is encouraged.  Walk up and down stairs slowly.  You may resume your normal activity in one to two days or as indicated by your physician. Sexual activity: NO intercourse for at least 2 weeks after the procedure, or as indicated by your Doctor.  Diet: Eat a light meal as desired this evening. You may resume your usual diet tomorrow.  Return to Work: You may resume your work activities in one to two days or as indicated by Therapist, sportsyour Doctor.  What to expect after your surgery: Expect to have vaginal bleeding/discharge for 2-3 days and spotting for up to 10 days. It is not unusual to have soreness for up to 1-2 weeks. You may have a slight burning sensation when you urinate for the first day. Mild cramps may continue for a couple of days. You may have a regular period in 2-6 weeks.  Call your doctor for any of the following:  Excessive vaginal bleeding or clotting, saturating and changing one pad every hour.  Inability to urinate 6 hours after discharge from hospital.  Pain not relieved by pain medication.  Fever of 100.4 F or greater.  Unusual vaginal discharge or odor.  Return to office _________________Call for an appointment ___________________  Patients signature: ______________________  Nurses signature ________________________  Support  person's signature________________________

## 2014-08-14 NOTE — Anesthesia Procedure Notes (Signed)
Procedure Name: LMA Insertion Date/Time: 08/14/2014 7:42 AM Performed by: Graciela HusbandsFUSSELL, Carlesha Seiple O Pre-anesthesia Checklist: Patient identified, Timeout performed, Emergency Drugs available, Suction available and Patient being monitored Patient Re-evaluated:Patient Re-evaluated prior to inductionOxygen Delivery Method: Circle system utilized Preoxygenation: Pre-oxygenation with 100% oxygen Intubation Type: IV induction Ventilation: Mask ventilation without difficulty LMA: LMA inserted LMA Size: 4.0 Number of attempts: 1 Placement Confirmation: positive ETCO2 and breath sounds checked- equal and bilateral Tube secured with: Tape Dental Injury: Teeth and Oropharynx as per pre-operative assessment

## 2014-08-14 NOTE — Transfer of Care (Signed)
Immediate Anesthesia Transfer of Care Note  Patient: Megan Hardy  Procedure(s) Performed: Procedure(s): DILATATION & CURETTAGE/HYSTEROSCOPY WITH MYOSURE (N/A)  Patient Location: PACU  Anesthesia Type:General  Level of Consciousness: awake, alert  and oriented  Airway & Oxygen Therapy: Patient Spontanous Breathing and Patient connected to nasal cannula oxygen  Post-op Assessment: Report given to RN and Post -op Vital signs reviewed and stable  Post vital signs: Reviewed and stable  Last Vitals:  Filed Vitals:   08/14/14 0604  BP: 111/74  Pulse: 60  Temp: 37 C  Resp: 20    Complications: No apparent anesthesia complications

## 2014-08-14 NOTE — Anesthesia Preprocedure Evaluation (Signed)
Anesthesia Evaluation  Patient identified by MRN, date of birth, ID band Patient awake    History of Anesthesia Complications (+) PONV and history of anesthetic complications  Airway Mallampati: I  TM Distance: >3 FB Neck ROM: Full    Dental  (+) Teeth Intact   Pulmonary asthma , Current Smoker,  breath sounds clear to auscultation        Cardiovascular negative cardio ROS  Rhythm:Regular     Neuro/Psych  Headaches, PSYCHIATRIC DISORDERS Anxiety  Neuromuscular disease    GI/Hepatic negative GI ROS, Neg liver ROS,   Endo/Other  negative endocrine ROS  Renal/GU negative Renal ROS     Musculoskeletal   Abdominal   Peds  Hematology negative hematology ROS (+)   Anesthesia Other Findings   Reproductive/Obstetrics                             Anesthesia Physical Anesthesia Plan  ASA: II  Anesthesia Plan: General   Post-op Pain Management:    Induction: Intravenous  Airway Management Planned: LMA  Additional Equipment: None  Intra-op Plan:   Post-operative Plan: Extubation in OR  Informed Consent: I have reviewed the patients History and Physical, chart, labs and discussed the procedure including the risks, benefits and alternatives for the proposed anesthesia with the patient or authorized representative who has indicated his/her understanding and acceptance.   Dental advisory given  Plan Discussed with: CRNA and Surgeon  Anesthesia Plan Comments:         Anesthesia Quick Evaluation

## 2014-08-14 NOTE — Brief Op Note (Signed)
08/14/2014  8:21 AM  PATIENT:  Jorje GuildKirsten C Livingood  34 y.o. female  PRE-OPERATIVE DIAGNOSIS:  aub, em mass  POST-OPERATIVE DIAGNOSIS:  aub, em mass  PROCEDURE:  Procedure(s): DILATATION & CURETTAGE/HYSTEROSCOPY WITH MYOSURE (N/A)  SURGEON:  Surgeon(s) and Role:    * Candice Campavid Lorenz Donley, MD - Primary  PHYSICIAN ASSISTANT:   ASSISTANTS: none   ANESTHESIA:   general  EBL:  Total I/O In: 1000 [I.V.:1000] Out: -   BLOOD ADMINISTERED:none  DRAINS: none   LOCAL MEDICATIONS USED:  NONE  SPECIMEN:  Source of Specimen:  endometrial polyps  DISPOSITION OF SPECIMEN:  PATHOLOGY  COUNTS:  YES  TOURNIQUET:  * No tourniquets in log *  DICTATION: .Other Dictation: Dictation Number 1  PLAN OF CARE: Discharge to home after PACU  PATIENT DISPOSITION:  PACU - hemodynamically stable.   Delay start of Pharmacological VTE agent (>24hrs) due to surgical blood loss or risk of bleeding: not applicable

## 2014-08-14 NOTE — Anesthesia Postprocedure Evaluation (Signed)
  Anesthesia Post-op Note  Patient: Megan Hardy  Procedure(s) Performed: Procedure(s): DILATATION & CURETTAGE/HYSTEROSCOPY WITH MYOSURE (N/A)  Patient Location: PACU  Anesthesia Type:General  Level of Consciousness: awake, alert  and oriented  Airway and Oxygen Therapy: Patient Spontanous Breathing  Post-op Pain: none  Post-op Assessment: Post-op Vital signs reviewed, Patient's Cardiovascular Status Stable, Respiratory Function Stable, Patent Airway, No signs of Nausea or vomiting and Pain level controlled  Post-op Vital Signs: Reviewed and stable  Last Vitals:  Filed Vitals:   08/14/14 0918  BP:   Pulse: 48  Temp: 36.6 C  Resp: 16    Complications: No apparent anesthesia complications

## 2014-08-15 ENCOUNTER — Encounter (HOSPITAL_COMMUNITY): Payer: Self-pay | Admitting: Obstetrics and Gynecology

## 2014-08-15 NOTE — Op Note (Signed)
NAME:  Megan Hardy, Megan Hardy             ACCOUNT NO.:  [id number]  MEDICAL RECORD NO.:  [id number]  LOCATION:  [facility]                          FACILITY:  [facility]  PHYSICIAN:  Dineen Kidavid C. Rana SnareLowe, M.D.    DATE OF BIRTH:  05-15-1980  DATE OF PROCEDURE:  08/14/2014 DATE OF DISCHARGE:  08/14/2014                              OPERATIVE REPORT   PREOPERATIVE DIAGNOSES:  Abnormal uterine bleeding and endometrial mass.  POSTOPERATIVE DIAGNOSES:  Abnormal uterine bleeding and endometrial mass.  PROCEDURE:  Hysteroscopy with dilation and MyoSure resection of endometrial mass.  SURGEON:  Dineen Kidavid C. Rana SnareLowe, MD  ANESTHESIA:  General by LMA.  INDICATIONS:  [name] is a 34 year old, she has been having abnormal uterine bleeding and wanted to figure out what was going on, underwent a saline infusion ultrasound showing moderate-sized endometrial mass.  She desires definitive surgical intervention and hysteroscopic resection of this mass.  Risks and benefits of the procedure were discussed at length.  Informed consent was obtained.  FINDINGS AT THE TIME OF SURGERY:  Moderate size endometrial polyp along the right lateral wall.  DESCRIPTION OF PROCEDURE:  After adequate analgesia, the patient placed in a dorsal lithotomy position.  She was sterilely prepped and draped. Bladder sterilely drained.  Graves speculum was placed.  Tenaculum placed on the anterior lip of the cervix.  Uterus sounds to 8 cm, easily dilated to a #23 Pratt dilator.  Hysteroscope was inserted and the above findings were noted.  Unable to get adequate visualization due to the part of the polyp into the cervical canal.  Endocervical polyp forceps were used to grasp and remove a portion of the endometrial polyp.  Re- examination with the hysteroscope revealed still a moderate-sized polyp in the right lateral wall.  MyoSure device was used to directly visualize and resect the polyp down to and flush with the endometrial wall.  After the  polyp had been removed completely, gentle sharp curettage was carried out retrieving small fragments of the endometrium. Re-examination revealed normal-appearing cavity with no evidence of complications, normal-appearing endometrial lining, and normal-appearing cervix.  The hysteroscope was then removed.  Tenaculum removed from the cervix and noted to be hemostatic.  The patient was then transferred to recovery room in stable condition.  Sponge and instrument count was normal x3.  Estimated blood loss was minimal.  Saline deficit was 500 mL.  DISPOSITION:  The patient will be discharged home and follow up in the office in 2-3 weeks and with routine instruction sheet for D and C, was given Toradol 30 mg IV postoperatively.     Dineen Kidavid C. Rana SnareLowe, M.D.     DCL/MEDQ  D:  08/14/2014  T:  08/14/2014  Job:  [id number]

## 2016-02-18 ENCOUNTER — Other Ambulatory Visit: Payer: Self-pay | Admitting: Obstetrics and Gynecology

## 2019-06-03 ENCOUNTER — Ambulatory Visit: Payer: PRIVATE HEALTH INSURANCE | Attending: Internal Medicine

## 2019-06-03 DIAGNOSIS — Z23 Encounter for immunization: Secondary | ICD-10-CM | POA: Insufficient documentation

## 2019-06-03 NOTE — Progress Notes (Signed)
   Covid-19 Vaccination Clinic  Name:  Megan Hardy    MRN: [id number] DOB: Apr 14, 1980  06/03/2019  Megan Hardy was observed post Covid-19 immunization for 15 minutes without incident. She was provided with Vaccine Information Sheet and instruction to access the V-Safe system.   Megan Hardy was instructed to call 911 with any severe reactions post vaccine: Marland Kitchen Difficulty breathing  . Swelling of face and throat  . A fast heartbeat  . A bad rash all over body  . Dizziness and weakness

## 2019-07-05 ENCOUNTER — Ambulatory Visit: Payer: 59 | Attending: Internal Medicine

## 2019-07-05 DIAGNOSIS — Z23 Encounter for immunization: Secondary | ICD-10-CM

## 2019-07-05 NOTE — Progress Notes (Signed)
   Covid-19 Vaccination Clinic  Name:  Megan Hardy    MRN: [id number] DOB: 06-Mar-1981  07/05/2019  Megan Hardy was observed post Covid-19 immunization for 15 minutes without incident. She was provided with Vaccine Information Sheet and instruction to access the V-Safe system.   Megan Hardy was instructed to call 911 with any severe reactions post vaccine: Marland Kitchen Difficulty breathing  . Swelling of face and throat  . A fast heartbeat  . A bad rash all over body  . Dizziness and weakness   Immunizations Administered    Name Date Dose VIS Date Route   Pfizer COVID-19 Vaccine 07/05/2019 10:56 AM 0.3 mL 03/11/2019 Intramuscular   Manufacturer: [employer], [employer]   Lot: [id number]   NDC: [address]-1

## 2019-12-22 ENCOUNTER — Other Ambulatory Visit: Payer: Self-pay | Admitting: Obstetrics and Gynecology
